# Patient Record
Sex: Male | Born: 1965 | Race: Black or African American | Hispanic: No | Marital: Married | Smoking: Never smoker
Health system: Southern US, Community
[De-identification: ages and names within clinical notes are randomized; demographics above are authoritative.]

## PROBLEM LIST (undated history)

## (undated) DIAGNOSIS — S92919A Unspecified fracture of unspecified toe(s), initial encounter for closed fracture: Secondary | ICD-10-CM

## (undated) DIAGNOSIS — N189 Chronic kidney disease, unspecified: Secondary | ICD-10-CM

## (undated) DIAGNOSIS — S62609A Fracture of unspecified phalanx of unspecified finger, initial encounter for closed fracture: Secondary | ICD-10-CM

## (undated) DIAGNOSIS — E669 Obesity, unspecified: Secondary | ICD-10-CM

## (undated) HISTORY — DX: Unspecified fracture of unspecified toe(s), initial encounter for closed fracture: S92.919A

## (undated) HISTORY — PX: OTHER SURGICAL HISTORY: SHX169

## (undated) HISTORY — DX: Fracture of unspecified phalanx of unspecified finger, initial encounter for closed fracture: S62.609A

## (undated) HISTORY — PX: TONSILLECTOMY: SUR1361

## (undated) HISTORY — DX: Chronic kidney disease, unspecified: N18.9

## (undated) HISTORY — DX: Obesity, unspecified: E66.9

---

## 2000-03-15 ENCOUNTER — Encounter: Payer: Self-pay | Admitting: Family Medicine

## 2000-03-15 ENCOUNTER — Encounter: Admission: RE | Admit: 2000-03-15 | Discharge: 2000-03-15 | Payer: Self-pay | Admitting: Family Medicine

## 2000-03-19 ENCOUNTER — Ambulatory Visit (HOSPITAL_BASED_OUTPATIENT_CLINIC_OR_DEPARTMENT_OTHER): Admission: RE | Admit: 2000-03-19 | Discharge: 2000-03-19 | Payer: Self-pay | Admitting: *Deleted

## 2008-01-22 ENCOUNTER — Emergency Department (HOSPITAL_COMMUNITY): Admission: EM | Admit: 2008-01-22 | Discharge: 2008-01-22 | Payer: Self-pay | Admitting: Emergency Medicine

## 2010-07-04 NOTE — Op Note (Signed)
[name]  Patient:    Steven Dawson, Steven Dawson                            MRN: [id number] Proc. Date: 03/19/00 Attending:  Lowell Bouton, M.D.                           Operative Report  PREOPERATIVE DIAGNOSIS:  Fracture-subluxation carpometacarpal joint, right thumb.  POSTOPERATIVE DIAGNOSIS: Fracture-subluxation carpometacarpal joint, right thumb.  PROCEDURE:  Closed reduction and percutaneous pinning of Bennett fracture, right thumb.  SURGEON:  Lowell Bouton, M.D.  ANESTHESIA:  General.  OPERATIVE FINDINGS:  The patient had a stable Bennetts fracture that was two weeks old.  The joint was partially subluxed.  DESCRIPTION OF PROCEDURE:  Under general anesthesia, the right hand was prepped and draped in the usual fashion and under x-ray control, a closed reduction was performed of the [facility] joint of the right thumb.  A 45 K-wire was placed through the first metacarpal, across the joint, into the trapezium. X-rays showed good alignment.  The pin was bent over and left protruding from the skin.  Sterile dressings were applied, followed by a thumb spica splint. The patient tolerated the procedure well and went to the recovery room awake and stable, in good condition. DD:  03/19/00 TD:  03/20/00 Job: [id number]

## 2013-05-04 ENCOUNTER — Ambulatory Visit
Admission: RE | Admit: 2013-05-04 | Discharge: 2013-05-04 | Disposition: A | Discharge status: Home / Self Care | Payer: BC Managed Care – PPO | Source: Ambulatory Visit | Attending: Family | Admitting: Family

## 2013-05-04 ENCOUNTER — Other Ambulatory Visit: Payer: Self-pay | Admitting: Family

## 2013-05-04 DIAGNOSIS — R0989 Other specified symptoms and signs involving the circulatory and respiratory systems: Secondary | ICD-10-CM

## 2013-05-04 DIAGNOSIS — R059 Cough, unspecified: Secondary | ICD-10-CM

## 2013-05-04 DIAGNOSIS — R05 Cough: Secondary | ICD-10-CM

## 2013-05-04 DIAGNOSIS — R0609 Other forms of dyspnea: Secondary | ICD-10-CM

## 2013-08-09 ENCOUNTER — Encounter: Payer: Self-pay | Admitting: Cardiology

## 2013-09-05 ENCOUNTER — Ambulatory Visit: Payer: Self-pay | Admitting: Cardiology

## 2013-10-02 ENCOUNTER — Encounter: Payer: Self-pay | Admitting: *Deleted

## 2013-10-06 ENCOUNTER — Ambulatory Visit (INDEPENDENT_AMBULATORY_CARE_PROVIDER_SITE_OTHER): Payer: BC Managed Care – PPO | Admitting: Cardiology

## 2013-10-06 ENCOUNTER — Encounter: Payer: Self-pay | Admitting: Cardiology

## 2013-10-06 DIAGNOSIS — I444 Left anterior fascicular block: Secondary | ICD-10-CM | POA: Insufficient documentation

## 2013-10-06 DIAGNOSIS — I4460 Unspecified fascicular block: Secondary | ICD-10-CM

## 2013-10-06 NOTE — Progress Notes (Signed)
      [address] [place], [place]  [address] Phone: [phone] Fax:  [phone]  Date:  10/06/2013   ID:  Steven Dawson Steven Dawson, DOB 1965/08/11, MRN [id number]  PCP:  No primary provider on file.   History of Present Illness: Steven Dawson is a 48 y.o. male with previous evaluation of chest discomfort.  His EKG demonstrated a sinus rhythm rate 67, left anterior fascicular block, nonspecific T-wave changes with subtle T-wave inversion in V3, flattening in the lateral precordial leads. His LDL cholesterol was 148, HDL 49. TSH was normal. He's also had some issues with fecal incontinence as well as morbid obesity.   About once a year he will have discomfort when breathing that comes on all of a sudden with pain in his upper neck, back of throat. When it happens he pauses and waits, takes a few deep breaths, duration for about 30 seconds and then it goes away. Never exertional. He has noted wheezing at times when he exerts himself. A stress test was performed on 03/07/12 where he exercised for 6 minutes and 27 seconds with nonspecific ST changes, slight wheezing as well as one out of 10 discomfort. His blood pressure was elevated at 140/102 at rest and 208/90 at stress.  No recent chest pain.  Trying new diet.     Wt Readings from Last 3 Encounters:  10/06/13 349 lb 6.4 oz (158.487 kg)     Past Medical History  Diagnosis Date  . Chronic kidney disease   . Broken fingers   . Broken toe   . Obesity     Past Surgical History  Procedure Laterality Date  . Tonsillectomy    . Pin in thumb      No current outpatient prescriptions on file.   No current facility-administered medications for this visit.    Allergies:   No Known Allergies  Social History:  The patient  reports that he has never smoked. He does not have any smokeless tobacco history on file. He reports that he drinks about 1.2 ounces of alcohol per week.   Family History  Problem Relation Age of Onset  .  Cancer Father   . Ulcers Brother     internal bleeding    ROS:  Please see the history of present illness.   No chest pain, no syncope, no bleeding, no orthopnea.   PHYSICAL EXAM: VS:  BP 145/91  Pulse 66  Ht 6\' 5"  (1.956 Steven)  Wt 349 lb 6.4 oz (158.487 kg)  BMI 41.42 kg/m2 Well nourished, well developed, in no acute distress HEENT: normal, [place]/AT, EOMI Neck: no JVD, normal carotid upstroke, no bruit Cardiac:  normal S1, S2; RRR; no murmur Lungs:  clear to auscultation bilaterally, no wheezing, rhonchi or rales Abd: soft, nontender, no hepatomegaly, no bruitsobese Ext: no edema, 2+ distal pulses Skin: warm and dry GU: deferred Neuro: no focal abnormalities noted, AAO x 3  EKG:  10/06/13-sinus rhythm, left anterior fascicular block, nonspecific ST-T wave changes    ASSESSMENT AND PLAN:  1. Morbid obesity-continue to work on weight loss. He is trying a new diet. Both he and his wife are using. He has lost approximately 13 pounds. No active chest pain. Overall doing well. If symptoms return or become more worrisome, he may return in followup. 2. 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 3. PRN follow up.   Signed, Steven SchultzMark Lyndsee Casa, MD [facility]  10/06/2013 4:16 PM

## 2013-10-06 NOTE — Patient Instructions (Signed)
The current medical regimen is effective;  continue present plan and medications.  Follow up as needed 

## 2015-01-23 ENCOUNTER — Other Ambulatory Visit: Payer: Self-pay | Admitting: Family Medicine

## 2015-01-23 ENCOUNTER — Ambulatory Visit
Admission: RE | Admit: 2015-01-23 | Discharge: 2015-01-23 | Disposition: A | Discharge status: Home / Self Care | Payer: Self-pay | Source: Ambulatory Visit | Attending: Family Medicine | Admitting: Family Medicine

## 2015-01-23 DIAGNOSIS — R059 Cough, unspecified: Secondary | ICD-10-CM

## 2015-01-23 DIAGNOSIS — R05 Cough: Secondary | ICD-10-CM

## 2015-02-05 ENCOUNTER — Other Ambulatory Visit: Payer: Self-pay | Admitting: Family

## 2015-02-05 ENCOUNTER — Ambulatory Visit
Admission: RE | Admit: 2015-02-05 | Discharge: 2015-02-05 | Disposition: A | Discharge status: Home / Self Care | Payer: No Typology Code available for payment source | Source: Ambulatory Visit | Attending: Family | Admitting: Family

## 2015-02-05 DIAGNOSIS — R059 Cough, unspecified: Secondary | ICD-10-CM

## 2015-02-05 DIAGNOSIS — R05 Cough: Secondary | ICD-10-CM

## 2015-02-05 DIAGNOSIS — Z8701 Personal history of pneumonia (recurrent): Secondary | ICD-10-CM

## 2015-02-22 ENCOUNTER — Other Ambulatory Visit: Payer: Self-pay | Admitting: Family

## 2015-02-22 ENCOUNTER — Ambulatory Visit
Admission: RE | Admit: 2015-02-22 | Discharge: 2015-02-22 | Disposition: A | Discharge status: Home / Self Care | Payer: BLUE CROSS/BLUE SHIELD | Source: Ambulatory Visit | Attending: Family | Admitting: Family

## 2015-02-22 DIAGNOSIS — Z8701 Personal history of pneumonia (recurrent): Secondary | ICD-10-CM

## 2015-03-22 ENCOUNTER — Encounter: Payer: Self-pay | Admitting: Internal Medicine

## 2015-03-22 ENCOUNTER — Other Ambulatory Visit: Payer: Self-pay | Admitting: Internal Medicine

## 2015-03-22 ENCOUNTER — Telehealth: Payer: Self-pay | Admitting: Internal Medicine

## 2015-03-22 ENCOUNTER — Ambulatory Visit (INDEPENDENT_AMBULATORY_CARE_PROVIDER_SITE_OTHER): Payer: BLUE CROSS/BLUE SHIELD | Admitting: Internal Medicine

## 2015-03-22 ENCOUNTER — Other Ambulatory Visit (INDEPENDENT_AMBULATORY_CARE_PROVIDER_SITE_OTHER): Payer: BLUE CROSS/BLUE SHIELD

## 2015-03-22 ENCOUNTER — Encounter (INDEPENDENT_AMBULATORY_CARE_PROVIDER_SITE_OTHER): Payer: Self-pay

## 2015-03-22 ENCOUNTER — Ambulatory Visit (INDEPENDENT_AMBULATORY_CARE_PROVIDER_SITE_OTHER)
Admission: RE | Admit: 2015-03-22 | Discharge: 2015-03-22 | Disposition: A | Discharge status: Home / Self Care | Payer: BLUE CROSS/BLUE SHIELD | Source: Ambulatory Visit | Attending: Internal Medicine | Admitting: Internal Medicine

## 2015-03-22 VITALS — BP 138/80 | HR 104 | Ht 77.0 in | Wt 367.0 lb

## 2015-03-22 DIAGNOSIS — R058 Other specified cough: Secondary | ICD-10-CM

## 2015-03-22 DIAGNOSIS — R05 Cough: Secondary | ICD-10-CM | POA: Diagnosis not present

## 2015-03-22 DIAGNOSIS — R918 Other nonspecific abnormal finding of lung field: Secondary | ICD-10-CM

## 2015-03-22 LAB — SEDIMENTATION RATE: Sed Rate: 7 mm/hr (ref 0–22)

## 2015-03-22 LAB — CBC WITH DIFFERENTIAL/PLATELET
BASOS ABS: 0 10*3/uL (ref 0.0–0.1)
Basophils Relative: 0.4 % (ref 0.0–3.0)
EOS ABS: 0.2 10*3/uL (ref 0.0–0.7)
Eosinophils Relative: 2.3 % (ref 0.0–5.0)
HCT: 45.2 % (ref 39.0–52.0)
Hemoglobin: 15.2 g/dL (ref 13.0–17.0)
LYMPHS ABS: 2.2 10*3/uL (ref 0.7–4.0)
Lymphocytes Relative: 29 % (ref 12.0–46.0)
MCHC: 33.7 g/dL (ref 30.0–36.0)
MCV: 91.4 fl (ref 78.0–100.0)
Monocytes Absolute: 0.4 10*3/uL (ref 0.1–1.0)
Monocytes Relative: 5.3 % (ref 3.0–12.0)
NEUTROS ABS: 4.8 10*3/uL (ref 1.4–7.7)
NEUTROS PCT: 63 % (ref 43.0–77.0)
PLATELETS: 278 10*3/uL (ref 150.0–400.0)
RBC: 4.95 Mil/uL (ref 4.22–5.81)
RDW: 14 % (ref 11.5–15.5)
WBC: 7.6 10*3/uL (ref 4.0–10.5)

## 2015-03-22 NOTE — Telephone Encounter (Signed)
Notes Recorded by Nyoka Cowden, MD on 03/22/2015 at 11:40 AM Call pt: Reviewed cxr and no acute change so no change in recommendations made at ov --  I spoke with patient about results and he verbalized understanding and had no questions.

## 2015-03-22 NOTE — Progress Notes (Signed)
Subjective:    Patient ID: Steven Dawson, male    DOB: 1966-02-08,     MRN: [id number]  HPI  57  yobm never smoker acutely ill late October 2016 with severe cough and pleuritic pain R lower ant chest > yellow mucus > rx 2 different abx/ pred/ inhaler >  referred to pulmonary clinic 03/22/2015 by   Boneta Lucks  PA for ? Persistent infiltrates p approp rx for CAP   03/22/2015 1st [facility] office visit/ Steven Dawson   Chief Complaint  Patient presents with  . Pulmonary Consult    Referred by Boneta Lucks, PA. Pt states was dxed with PNA in Nov 2016. He has occ non prod cough and feels the need to clear his throat often.   R ant cp gone,  Rare need to clear throat but freq sensation of throat tickle day >>noct x years that is worse since onset of acute symptoms in Oct 2016 but no noct symptoms, wheeze, or sob  No obvious day to day or daytime variabilty or assoc  cp or chest tightness, subjective wheeze or overt hb symptoms. No unusual exp hx or h/o childhood pna/ asthma or knowledge of premature birth.  Sleeping ok without nocturnal  or early am exacerbation  of respiratory  c/o's or need for noct saba. Also denies any obvious fluctuation of symptoms with weather or environmental changes or other aggravating or alleviating factors except as outlined above   Current Medications, Allergies, Complete Past Medical History, Past Surgical History, Family History, and Social History were reviewed in [employer] record.             Review of Systems  Constitutional: Negative for fever, chills, activity change, appetite change and unexpected weight change.  HENT: Negative for congestion, dental problem, postnasal drip, rhinorrhea, sneezing, sore throat, trouble swallowing and voice change.   Eyes: Negative for visual disturbance.  Respiratory: Negative for cough, choking and shortness of breath.   Cardiovascular: Negative for chest pain and leg swelling.    Gastrointestinal: Negative for nausea, vomiting and abdominal pain.  Genitourinary: Negative for difficulty urinating.  Musculoskeletal: Negative for arthralgias.  Skin: Negative for rash.  Psychiatric/Behavioral: Negative for behavioral problems and confusion.       Objective:   Physical Exam  amb bm with nasal tone to voice  Wt Readings from Last 3 Encounters:  03/22/15 367 lb (166.47 kg)  10/06/13 349 lb 6.4 oz (158.487 kg)    Vital signs reviewed  HEENT: nl dentition, turbinates, and oropharynx. Nl external ear canals without cough reflex   NECK :  without JVD/Nodes/TM/ nl carotid upstrokes bilaterally   LUNGS: no acc muscle use,  Nl contour chest which is clear to A and P bilaterally without cough on insp or exp maneuvers   CV:  RRR  no s3 or murmur or increase in P2, no edema   ABD:  soft and nontender with nl inspiratory excursion in the supine position. No bruits or organomegaly, bowel sounds nl  MS:  Nl gait/ ext warm without deformities, calf tenderness, cyanosis or clubbing No obvious joint restrictions   SKIN: warm and dry without lesions    NEURO:  alert, approp, nl sensorium with  no motor deficits     CXR PA and Lateral:   03/22/2015 :    I personally reviewed images and agree with radiology impression as follows:   There is no evidence of residual pneumonia nor evidence of other active cardiopulmonary disease.  Labs 03/22/2015  Lab Results  Component Value Date   WBC 7.6 03/22/2015   HGB 15.2 03/22/2015   HCT 45.2 03/22/2015   MCV 91.4 03/22/2015   PLT 278.0 03/22/2015       EOS                      0.2    Lab Results  Component Value Date   ESRSEDRATE 7 03/22/2015       Assessment & Plan:

## 2015-03-22 NOTE — Patient Instructions (Addendum)
Please see patient coordinator before you leave today  to schedule sinus CT  GERD (REFLUX)  is an extremely common cause of respiratory symptoms just like yours , many times with no obvious heartburn at all.    It can be treated with medication, but also with lifestyle changes including elevation of the head of your bed (ideally with 6 inch  bed blocks),  Smoking cessation, avoidance of late meals, excessive alcohol, and avoid fatty foods, chocolate, peppermint, colas, red wine, and acidic juices such as orange juice.  NO MINT OR MENTHOL PRODUCTS SO NO COUGH DROPS  USE SUGARLESS CANDY INSTEAD (Jolley ranchers or Stover's or Life Savers) or even ice chips will also do - the key is to swallow to prevent all throat clearing. NO OIL BASED VITAMINS - use powdered substitutes.   Please remember to go to the lab and x-ray department downstairs for your tests - we will call you with the results when they are available.  If can't stop clearing your throat on the above plan I would next add prilosec 20 mg Take 30-60 min before first meal of the day and return here after 2 weeks if not improving

## 2015-03-22 NOTE — Telephone Encounter (Signed)
[phone] calling back

## 2015-03-22 NOTE — Progress Notes (Signed)
Quick Note:  LMTCB ______ 

## 2015-03-23 ENCOUNTER — Encounter: Payer: Self-pay | Admitting: Internal Medicine

## 2015-03-23 DIAGNOSIS — R918 Other nonspecific abnormal finding of lung field: Secondary | ICD-10-CM | POA: Insufficient documentation

## 2015-03-23 LAB — SICKLE CELL SCREEN: SICKLE CELL SCREEN: NEGATIVE

## 2015-03-23 NOTE — Assessment & Plan Note (Signed)
Body mass index is 43.51 kg/(m^2).  No results found for: TSH   Contributing to gerd tendency/ doe/reviewed the need and the process to achieve and maintain neg calorie balance > defer f/u primary care including intermittently monitoring thyroid status

## 2015-03-23 NOTE — Assessment & Plan Note (Addendum)
Classic Upper airway cough syndrome, so named because it's frequently impossible to sort out how much is  CR/sinusitis with freq throat clearing (which can be related to primary GERD)   vs  causing  secondary (" extra esophageal")  GERD from wide swings in gastric pressure that occur with throat clearing, often  promoting self use of mint and menthol lozenges that reduce the lower esophageal sphincter tone and exacerbate the problem further in a cyclical fashion.   These are the same pts (now being labeled as having "irritable larynx syndrome" by some cough centers) who not infrequently have a history of having failed to tolerate ace inhibitors,  dry powder inhalers or biphosphonates or report having atypical reflux symptoms that don't respond to standard doses of PPI , and are easily confused as having aecopd or asthma flares by even experienced allergists/ pulmonologists.   rec first treat with diet/ instructions not to clear throat and use non-mint/menthol hard rock candies for this and if not better add otc ACID suppression next along with sinus CT/allergy profile  now to complete the w/u

## 2015-03-23 NOTE — Assessment & Plan Note (Addendum)
Assoc with clinical picture of pna, though time course is a bit odd suggesting he may have an area of bronchiectasis or "organizing pna" ( ruled out now with nl esr)  and if has recurrent pna in same area need a high res CT to define the problem in more detail  - as his plain cxr and exam and hx are unimpressive and he's never smoked would not pursue further studies at this point.  Pulmonary f/u can be prn  Total time devoted to counseling  = 35/67mreview case with pt/ discussion of options/alternatives/ personally creating in presence of pt  then going over specific  Instructions directly with the pt including how to use all of the meds but in particular covering each new medication in detail (see avs)

## 2015-03-25 ENCOUNTER — Inpatient Hospital Stay: Admission: RE | Admit: 2015-03-25 | Payer: BLUE CROSS/BLUE SHIELD | Source: Ambulatory Visit

## 2015-03-26 LAB — RESPIRATORY ALLERGY PROFILE REGION II ~~LOC~~
Allergen, Cedar tree, t12: <0.1 kU/L
Allergen, Mouse Urine Protein, e78: <0.1 kU/L
Alternaria Alternata: <0.1 kU/L
Aspergillus fumigatus, m3: <0.1 kU/L
Box Elder IgE: <0.1 kU/L
Cat Dander: <0.1 kU/L
Common Ragweed: <0.1 kU/L
IGE (IMMUNOGLOBULIN E), SERUM: 25 kU/L (ref <115)
Johnson Grass: <0.1 kU/L
Pecan/Hickory Tree IgE: <0.1 kU/L
Penicillium Notatum: <0.1 kU/L
Rough Pigweed  IgE: <0.1 kU/L
Sheep Sorrel IgE: <0.1 kU/L
Timothy Grass: <0.1 kU/L

## 2015-04-01 ENCOUNTER — Ambulatory Visit (INDEPENDENT_AMBULATORY_CARE_PROVIDER_SITE_OTHER)
Admission: RE | Admit: 2015-04-01 | Discharge: 2015-04-01 | Disposition: A | Discharge status: Home / Self Care | Payer: BLUE CROSS/BLUE SHIELD | Source: Ambulatory Visit | Attending: Internal Medicine | Admitting: Internal Medicine

## 2015-04-01 DIAGNOSIS — R05 Cough: Secondary | ICD-10-CM

## 2015-04-02 ENCOUNTER — Telehealth: Payer: Self-pay | Admitting: Internal Medicine

## 2015-04-02 NOTE — Telephone Encounter (Signed)
Patient returned call, CB [phone]. °

## 2015-04-02 NOTE — Telephone Encounter (Signed)
Result Notes     Notes Recorded by Lorel Monaco, CMA on 04/02/2015 at 1:24 PM lmtcb x1 for pt. ------  Notes Recorded by Nyoka Cowden, MD on 04/01/2015 at 4:16 PM Call patient : Study is unremarkable, no change in recs   lmtcb x2 for pt.

## 2015-04-02 NOTE — Telephone Encounter (Signed)
Patient returned call, CB [phone].

## 2015-04-02 NOTE — Telephone Encounter (Signed)
LMOMTCB x 1 

## 2015-04-03 ENCOUNTER — Ambulatory Visit (INDEPENDENT_AMBULATORY_CARE_PROVIDER_SITE_OTHER): Payer: BLUE CROSS/BLUE SHIELD

## 2015-04-03 DIAGNOSIS — Z23 Encounter for immunization: Secondary | ICD-10-CM | POA: Diagnosis not present

## 2015-04-03 NOTE — Telephone Encounter (Signed)
It is all negative. I apologized for delay but it trickled down one piece of the time. He does not have any evidence of sickle cell disease or an allergy of any type. Please let me know if he is not doing better on the regimen I recommended that he should return with all medications in hand to regroup.Marland Kitchen

## 2015-04-03 NOTE — Telephone Encounter (Signed)
Spoke with pt. He is aware of his CT results. Would like his results from his blood work done on 03/22/15.  MW - please advise. Thanks.  Patient Instructions     Please see patient coordinator before you leave today to schedule sinus CT  GERD (REFLUX) is an extremely common cause of respiratory symptoms just like yours , many times with no obvious heartburn at all.   It can be treated with medication, but also with lifestyle changes including elevation of the head of your bed (ideally with 6 inch bed blocks), Smoking cessation, avoidance of late meals, excessive alcohol, and avoid fatty foods, chocolate, peppermint, colas, red wine, and acidic juices such as orange juice.  NO MINT OR MENTHOL PRODUCTS SO NO COUGH DROPS  USE SUGARLESS CANDY INSTEAD (Jolley ranchers or Stover's or Life Savers) or even ice chips will also do - the key is to swallow to prevent all throat clearing. NO OIL BASED VITAMINS - use powdered substitutes.   Please remember to go to the lab and x-ray department downstairs for your tests - we will call you with the results when they are available.  If can't stop clearing your throat on the above plan I would next add prilosec 20 mg Take 30-60 min before first meal of the day and return here after 2 weeks if not improving

## 2015-04-03 NOTE — Telephone Encounter (Signed)
I spoke with patient about results and he verbalized understanding and had no questions. He wanted to come in for flu vaccine today. Placed on injection schedule. Nothing further needed

## 2015-04-09 ENCOUNTER — Ambulatory Visit: Payer: BLUE CROSS/BLUE SHIELD | Admitting: Internal Medicine

## 2015-05-23 DIAGNOSIS — J019 Acute sinusitis, unspecified: Secondary | ICD-10-CM | POA: Diagnosis not present

## 2015-05-23 DIAGNOSIS — M25522 Pain in left elbow: Secondary | ICD-10-CM | POA: Diagnosis not present

## 2015-05-23 DIAGNOSIS — B9689 Other specified bacterial agents as the cause of diseases classified elsewhere: Secondary | ICD-10-CM | POA: Diagnosis not present

## 2015-05-31 DIAGNOSIS — R05 Cough: Secondary | ICD-10-CM | POA: Diagnosis not present

## 2015-06-18 ENCOUNTER — Ambulatory Visit
Admission: RE | Admit: 2015-06-18 | Discharge: 2015-06-18 | Disposition: A | Discharge status: Home / Self Care | Payer: BLUE CROSS/BLUE SHIELD | Source: Ambulatory Visit | Attending: Family Medicine | Admitting: Family Medicine

## 2015-06-18 ENCOUNTER — Other Ambulatory Visit: Payer: Self-pay | Admitting: Family Medicine

## 2015-06-18 DIAGNOSIS — R05 Cough: Secondary | ICD-10-CM

## 2015-06-18 DIAGNOSIS — R059 Cough, unspecified: Secondary | ICD-10-CM

## 2015-06-18 DIAGNOSIS — R0989 Other specified symptoms and signs involving the circulatory and respiratory systems: Secondary | ICD-10-CM | POA: Diagnosis not present

## 2015-11-19 DIAGNOSIS — N182 Chronic kidney disease, stage 2 (mild): Secondary | ICD-10-CM | POA: Diagnosis not present

## 2015-11-20 DIAGNOSIS — Z6841 Body Mass Index (BMI) 40.0 and over, adult: Secondary | ICD-10-CM | POA: Diagnosis not present

## 2015-11-20 DIAGNOSIS — N281 Cyst of kidney, acquired: Secondary | ICD-10-CM | POA: Diagnosis not present

## 2015-11-20 DIAGNOSIS — N182 Chronic kidney disease, stage 2 (mild): Secondary | ICD-10-CM | POA: Diagnosis not present

## 2016-05-01 DIAGNOSIS — Z0000 Encounter for general adult medical examination without abnormal findings: Secondary | ICD-10-CM | POA: Diagnosis not present

## 2016-05-07 DIAGNOSIS — Z0000 Encounter for general adult medical examination without abnormal findings: Secondary | ICD-10-CM | POA: Diagnosis not present

## 2016-11-13 DIAGNOSIS — N182 Chronic kidney disease, stage 2 (mild): Secondary | ICD-10-CM | POA: Diagnosis not present

## 2016-11-19 DIAGNOSIS — N281 Cyst of kidney, acquired: Secondary | ICD-10-CM | POA: Diagnosis not present

## 2016-11-19 DIAGNOSIS — N182 Chronic kidney disease, stage 2 (mild): Secondary | ICD-10-CM | POA: Diagnosis not present

## 2016-11-19 DIAGNOSIS — Z6841 Body Mass Index (BMI) 40.0 and over, adult: Secondary | ICD-10-CM | POA: Diagnosis not present

## 2016-11-23 DIAGNOSIS — N281 Cyst of kidney, acquired: Secondary | ICD-10-CM | POA: Diagnosis not present

## 2017-03-24 DIAGNOSIS — M2142 Flat foot [pes planus] (acquired), left foot: Secondary | ICD-10-CM | POA: Diagnosis not present

## 2017-03-24 DIAGNOSIS — M2141 Flat foot [pes planus] (acquired), right foot: Secondary | ICD-10-CM | POA: Diagnosis not present

## 2017-04-17 DIAGNOSIS — R05 Cough: Secondary | ICD-10-CM | POA: Diagnosis not present

## 2017-04-17 DIAGNOSIS — J101 Influenza due to other identified influenza virus with other respiratory manifestations: Secondary | ICD-10-CM | POA: Diagnosis not present

## 2017-04-23 DIAGNOSIS — M79671 Pain in right foot: Secondary | ICD-10-CM | POA: Diagnosis not present

## 2017-04-23 DIAGNOSIS — R030 Elevated blood-pressure reading, without diagnosis of hypertension: Secondary | ICD-10-CM | POA: Diagnosis not present

## 2017-04-23 DIAGNOSIS — J101 Influenza due to other identified influenza virus with other respiratory manifestations: Secondary | ICD-10-CM | POA: Diagnosis not present

## 2017-05-03 ENCOUNTER — Other Ambulatory Visit: Payer: Self-pay | Admitting: Family Medicine

## 2017-05-03 DIAGNOSIS — M79671 Pain in right foot: Secondary | ICD-10-CM

## 2017-05-04 DIAGNOSIS — Z23 Encounter for immunization: Secondary | ICD-10-CM | POA: Diagnosis not present

## 2017-05-04 DIAGNOSIS — Z0000 Encounter for general adult medical examination without abnormal findings: Secondary | ICD-10-CM | POA: Diagnosis not present

## 2017-05-07 DIAGNOSIS — R7309 Other abnormal glucose: Secondary | ICD-10-CM | POA: Diagnosis not present

## 2017-05-09 ENCOUNTER — Inpatient Hospital Stay
Admission: RE | Admit: 2017-05-09 | Discharge: 2017-05-09 | Disposition: A | Discharge status: Home / Self Care | Payer: BLUE CROSS/BLUE SHIELD | Source: Ambulatory Visit | Attending: Family Medicine | Admitting: Family Medicine

## 2017-05-10 ENCOUNTER — Ambulatory Visit
Admission: RE | Admit: 2017-05-10 | Discharge: 2017-05-10 | Disposition: A | Discharge status: Home / Self Care | Payer: BLUE CROSS/BLUE SHIELD | Source: Ambulatory Visit | Attending: Family Medicine | Admitting: Family Medicine

## 2017-05-10 DIAGNOSIS — M79671 Pain in right foot: Secondary | ICD-10-CM

## 2017-05-10 DIAGNOSIS — M19071 Primary osteoarthritis, right ankle and foot: Secondary | ICD-10-CM | POA: Diagnosis not present

## 2017-05-12 DIAGNOSIS — N182 Chronic kidney disease, stage 2 (mild): Secondary | ICD-10-CM | POA: Diagnosis not present

## 2017-05-13 ENCOUNTER — Encounter: Payer: Self-pay | Admitting: Dietician

## 2017-05-13 ENCOUNTER — Encounter: Payer: BLUE CROSS/BLUE SHIELD | Attending: Family Medicine | Admitting: Dietician

## 2017-05-13 DIAGNOSIS — R7303 Prediabetes: Secondary | ICD-10-CM | POA: Insufficient documentation

## 2017-05-13 DIAGNOSIS — Z713 Dietary counseling and surveillance: Secondary | ICD-10-CM | POA: Diagnosis not present

## 2017-05-13 NOTE — Progress Notes (Signed)
  Medical Nutrition Therapy:  Appt start time: 1415 end time:  1515.   Assessment:  Primary concerns today: Obesity and prediabetes.  Most recent A1c was 5.9%.  Patient started an exercise routine 1 year ago and is now working out 4-5 days per week for 1 hour at the gym, mostly with a Systems analystpersonal trainer.  He has lost approximately 35 lbs over the last year.  He states that he is motivated to manage his blood sugar levels and focus on creating some healthy food habits this next year.  He does occasional fasting throughout the year, normally twice per year for 2-3 days, but occasionally longer.  He has tried intermittent fasting but has noticed a challenge with managing portions and cravings later in the day when he skips breakfast for the fasting schedule.  He does his exercise in the morning so this is also a problem as far as refueling after his workouts.  He is open to recommendations for dietary strategies to improve his eating habits, lower his A1c and help him continue to lose weight.    Preferred Learning Style:  No preference indicated   Learning Readiness:   Ready  MEDICATIONS: none   DIETARY INTAKE:  Usual eating pattern includes 2-3 meals and 2-3 snacks per day.  24-hr recall:  B ( AM): eggs, croissant, guacamole, and salsa with coffee from [employer] or skips and just has coffee Snk ( AM): none L ( PM): grilled salmon with broccolli and small serving of rice Snk ( PM): celery with guacamole or hard boiled eggs D ( PM): celery, cashews, 1 lbsp peanut butter OR out to eat with his sons when traveling for sports Snk ( PM): veggies and dip or sweets  Usual physical activity: 4-5 days per week 1 hour in the gym doing light cardio, heavy weight training  Progress Towards Goal(s):  In progress.   Nutritional Diagnosis:  NB-1.1 Food and nutrition-related knowledge deficit As related to no prior formal nutrition education.  As evidenced by patient request for dietary advice to maintain  proper blood sugar levels and weight loss.    Intervention:  Nutrition education and counseling.  Discussed his nutrition goals and habit changes over the past year.  Discussed balance and moderation utilizing the portion plate as a guide for portion control.  Discussed importance of meal timing and appetite management.  Discussed aspects of mindfulness around eating and cravings.  Patient states he has a big sweet tooth.  We discussed role of deprivation and restriction in leading to food urges/cravings as well as the emotional eating aspect of choosing food for excitement, pleasure, comfort, or distraction.  Reviewed a moderate carbohydrate plan for him, providing sample meals and snack ideas.  Assessed his readiness to change and motivation level.  Answered his nutrition questions.  Teaching Method Utilized: Visual Auditory Hands on  Handouts given during visit include:  Low Carbohydrate Snack Suggestions  Sample Meal Plans for Men (45 grams of Carbohydrate)  Portion Plate  Weekly Meal Plan Template  Barriers to learning/adherence to lifestyle change: none  Demonstrated degree of understanding via:  Teach Back   Monitoring/Evaluation:  Dietary intake, exercise, progress towards goals, and body weight in 3 month(s).

## 2017-05-20 DIAGNOSIS — N281 Cyst of kidney, acquired: Secondary | ICD-10-CM | POA: Diagnosis not present

## 2017-05-20 DIAGNOSIS — N182 Chronic kidney disease, stage 2 (mild): Secondary | ICD-10-CM | POA: Diagnosis not present

## 2017-05-20 DIAGNOSIS — R7303 Prediabetes: Secondary | ICD-10-CM | POA: Diagnosis not present

## 2017-05-28 DIAGNOSIS — M7750 Other enthesopathy of unspecified foot: Secondary | ICD-10-CM | POA: Diagnosis not present

## 2017-06-01 DIAGNOSIS — M7750 Other enthesopathy of unspecified foot: Secondary | ICD-10-CM | POA: Diagnosis not present

## 2017-07-08 DIAGNOSIS — M7750 Other enthesopathy of unspecified foot: Secondary | ICD-10-CM | POA: Diagnosis not present

## 2017-07-14 DIAGNOSIS — M7750 Other enthesopathy of unspecified foot: Secondary | ICD-10-CM | POA: Diagnosis not present

## 2017-07-21 DIAGNOSIS — M7750 Other enthesopathy of unspecified foot: Secondary | ICD-10-CM | POA: Diagnosis not present

## 2017-07-26 DIAGNOSIS — M7750 Other enthesopathy of unspecified foot: Secondary | ICD-10-CM | POA: Diagnosis not present

## 2017-07-29 ENCOUNTER — Encounter: Payer: BLUE CROSS/BLUE SHIELD | Attending: Family Medicine | Admitting: Registered"

## 2017-07-29 ENCOUNTER — Encounter: Payer: Self-pay | Admitting: Registered"

## 2017-07-29 DIAGNOSIS — R7303 Prediabetes: Secondary | ICD-10-CM

## 2017-07-29 DIAGNOSIS — Z713 Dietary counseling and surveillance: Secondary | ICD-10-CM | POA: Insufficient documentation

## 2017-07-29 NOTE — Progress Notes (Signed)
Medical Nutrition Therapy:  Appt start time: 1505 end time:  1550.   Assessment:  Primary concerns today: Obesity and prediabetes.  Most recent A1c was 5.9%.  Nutrition Follow-Up: Pt present for appointment. Pt reports that he has been reducing the amount of sweeteners and creamers that he has in his beverages. He reports that now he does not have to have foods as sweet as before. He reports that he has been trying to follow meal pattern discussed with dietitian at last visit. He reports that he has still not been having breakfast each day-sometimes will drink a protein shake for breakfast or may have fruit and nuts. Pt reports that sometimes he does intermittent fasting from 8 PM to 8 AM and will work out during his fasting period. Pt reports he goes to PT for foot, elbow, wrist, shoulder and back pain. He reports that he typically gets around 5-6 hours of sleep per night during the week; often  more on the weekends.   Preferred Learning Style:  No preference indicated   Learning Readiness:   Ready  MEDICATIONS: none   DIETARY INTAKE:  Usual eating pattern includes 2 meals and 2 snacks per day. Usually drinks a protein shake for breakfast such as Core, Evolve, or Isopure or may have some fruit and nuts. Typical snack foods include-almonds, [place] jerky.   24-hr recall:  (430 AM): water before gym  B (630-7 AM) Evolve protein shake, bottle of water Snk (AM) Almonds  L ( PM): Core protein shake, apple (typical lunch would be tuna fish, crackers, apple) Snk ( PM): None reported.  D ( PM): salmon, broccoli, rice, water OR diet drink Snk ( PM): water, coffee or tea usually unsweetened may have 1 creamer, or diet drink  Usual physical activity: 4 days per week goes to gym for 1 hour. Other activities include playing golf, coaching basketball   Progress Towards Goal(s):  In progress.   Nutritional Diagnosis:  NB-1.1 Food and nutrition-related knowledge deficit As related to no prior  formal nutrition education.  As evidenced by patient request for dietary advice to maintain proper blood sugar levels and weight loss.    Intervention:  Nutrition education and counseling. Praised pt's progress with reducing amount of creamer and sweetener in beverages. Discussed including adequate amount of carbohydrates in meals-specifically breakfast and how having a snack before going to the gym can provide more fuel for workout. Discussed having foods prepped ahead of time for breakfast and easy go to breakfast foods (Greek yogurt and fruit,nuts OR boiled eggs with whole wheat toast, fruit, etc). Discussed what a balanced plate looks like using the portion plate and provided education on which foods provide carbohydrates. Provided education on mindful eating. Discussed how having regular, consistent meals can help [place] be more mindful at our mealtimes. Pt appeared agreeable to information/goals discussed.   Instructions/Goals:  Make sure to get in three meals per day. Try to have balanced meals like the My Plate example (see handout). Try to include more vegetables, fruits, and whole grains at meals.   Continue working to have balanced meals. Recommend including around 3-4 carbohydrate choices (45-60 g carbohydrates) per meal (see balanced plate)   Recommend having easy go to foods ready to have for breakfast in the morning. Include protein and carbohydrates-balanced breakfast-ideas: Plain [place] yogurt with your own fruit and nuts or seeds; boiled eggs, whole wheat toast, fruit, etc.  Continue including regular physical activity.   Teaching Method Utilized: Visual Auditory Hands on  Handouts given during visit include:  Balanced Plate and food list   Meal Card  Barriers to learning/adherence to lifestyle change: none  Demonstrated degree of understanding via:  Teach Back   Monitoring/Evaluation:  Dietary intake, exercise, progress towards goals, and body weight prn.

## 2017-07-29 NOTE — Patient Instructions (Addendum)
Instructions/Goals:  Make sure to get in three meals per day. Try to have balanced meals like the My Plate example (see handout). Try to include more vegetables, fruits, and whole grains at meals.   Continue working to have balanced meals. Recommend including around 3-4 carbohydrate choices (45-60 g carbohydrates) per meal (see balanced plate)   Recommend having easy go to foods ready to have for breakfast in the morning. Include protein and carbohydrates-balanced breakfast-ideas: Plain [place] yogurt with your own fruit and nuts or seeds; boiled eggs, whole wheat toast, fruit, etc.  Continue including regular physical activity.

## 2017-08-11 DIAGNOSIS — M7750 Other enthesopathy of unspecified foot: Secondary | ICD-10-CM | POA: Diagnosis not present

## 2017-08-11 DIAGNOSIS — M545 Low back pain: Secondary | ICD-10-CM | POA: Diagnosis not present

## 2017-08-13 DIAGNOSIS — M25511 Pain in right shoulder: Secondary | ICD-10-CM | POA: Diagnosis not present

## 2017-08-13 DIAGNOSIS — M7671 Peroneal tendinitis, right leg: Secondary | ICD-10-CM | POA: Diagnosis not present

## 2017-08-13 DIAGNOSIS — M7702 Medial epicondylitis, left elbow: Secondary | ICD-10-CM | POA: Diagnosis not present

## 2017-08-13 DIAGNOSIS — M545 Low back pain: Secondary | ICD-10-CM | POA: Diagnosis not present

## 2017-08-23 DIAGNOSIS — M545 Low back pain: Secondary | ICD-10-CM | POA: Diagnosis not present

## 2017-08-23 DIAGNOSIS — M7750 Other enthesopathy of unspecified foot: Secondary | ICD-10-CM | POA: Diagnosis not present

## 2017-08-24 DIAGNOSIS — M7750 Other enthesopathy of unspecified foot: Secondary | ICD-10-CM | POA: Diagnosis not present

## 2017-08-24 DIAGNOSIS — M545 Low back pain: Secondary | ICD-10-CM | POA: Diagnosis not present

## 2017-09-06 DIAGNOSIS — M7702 Medial epicondylitis, left elbow: Secondary | ICD-10-CM | POA: Diagnosis not present

## 2017-09-06 DIAGNOSIS — M545 Low back pain: Secondary | ICD-10-CM | POA: Diagnosis not present

## 2017-09-13 DIAGNOSIS — M7702 Medial epicondylitis, left elbow: Secondary | ICD-10-CM | POA: Diagnosis not present

## 2017-09-13 DIAGNOSIS — M545 Low back pain: Secondary | ICD-10-CM | POA: Diagnosis not present

## 2017-09-24 DIAGNOSIS — M7702 Medial epicondylitis, left elbow: Secondary | ICD-10-CM | POA: Diagnosis not present

## 2017-09-24 DIAGNOSIS — M545 Low back pain: Secondary | ICD-10-CM | POA: Diagnosis not present

## 2018-04-12 DIAGNOSIS — M25562 Pain in left knee: Secondary | ICD-10-CM | POA: Diagnosis not present

## 2018-04-12 DIAGNOSIS — M25552 Pain in left hip: Secondary | ICD-10-CM | POA: Diagnosis not present

## 2018-04-18 DIAGNOSIS — M25511 Pain in right shoulder: Secondary | ICD-10-CM | POA: Diagnosis not present

## 2018-05-18 DIAGNOSIS — Z125 Encounter for screening for malignant neoplasm of prostate: Secondary | ICD-10-CM | POA: Diagnosis not present

## 2018-05-18 DIAGNOSIS — Z13220 Encounter for screening for lipoid disorders: Secondary | ICD-10-CM | POA: Diagnosis not present

## 2018-05-18 DIAGNOSIS — Z0000 Encounter for general adult medical examination without abnormal findings: Secondary | ICD-10-CM | POA: Diagnosis not present

## 2018-05-24 DIAGNOSIS — Z6841 Body Mass Index (BMI) 40.0 and over, adult: Secondary | ICD-10-CM | POA: Diagnosis not present

## 2018-05-24 DIAGNOSIS — N281 Cyst of kidney, acquired: Secondary | ICD-10-CM | POA: Diagnosis not present

## 2018-05-24 DIAGNOSIS — N182 Chronic kidney disease, stage 2 (mild): Secondary | ICD-10-CM | POA: Diagnosis not present

## 2018-07-28 DIAGNOSIS — R635 Abnormal weight gain: Secondary | ICD-10-CM | POA: Diagnosis not present

## 2018-07-28 DIAGNOSIS — Z79899 Other long term (current) drug therapy: Secondary | ICD-10-CM | POA: Diagnosis not present

## 2018-07-28 DIAGNOSIS — R7303 Prediabetes: Secondary | ICD-10-CM | POA: Diagnosis not present

## 2018-07-28 DIAGNOSIS — I498 Other specified cardiac arrhythmias: Secondary | ICD-10-CM | POA: Diagnosis not present

## 2018-07-28 DIAGNOSIS — E669 Obesity, unspecified: Secondary | ICD-10-CM | POA: Diagnosis not present

## 2018-07-28 DIAGNOSIS — Z6839 Body mass index (BMI) 39.0-39.9, adult: Secondary | ICD-10-CM | POA: Diagnosis not present

## 2018-07-28 DIAGNOSIS — I444 Left anterior fascicular block: Secondary | ICD-10-CM | POA: Diagnosis not present

## 2018-09-12 DIAGNOSIS — Z01818 Encounter for other preprocedural examination: Secondary | ICD-10-CM | POA: Diagnosis not present

## 2018-11-16 ENCOUNTER — Other Ambulatory Visit: Payer: Self-pay

## 2018-11-16 DIAGNOSIS — R6889 Other general symptoms and signs: Secondary | ICD-10-CM | POA: Diagnosis not present

## 2018-11-16 DIAGNOSIS — Z20822 Contact with and (suspected) exposure to covid-19: Secondary | ICD-10-CM

## 2018-11-17 LAB — NOVEL CORONAVIRUS, NAA: SARS-CoV-2, NAA: NOT DETECTED

## 2019-01-19 IMAGING — MR MR FOOT*R* W/O CM
4 of 6 series · 23 of 40 positions shown · non-contrast
Comparison: None.

CLINICAL DATA: Pain along the fifth metatarsal.

EXAM:
MRI OF THE RIGHT FOREFOOT WITHOUT CONTRAST
TECHNIQUE: Multiplanar, multisequence MR imaging of the right forefoot was
performed. No intravenous contrast was administered.

[Series 5: T1 · coronal · 4.0mm · 0.44mm/px · 4 of 39 slices shown]
[im 1/39]
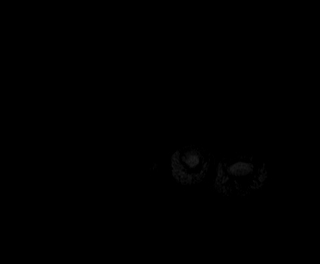
[im 6/39]
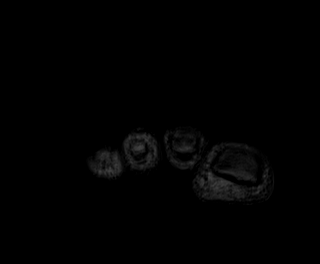
[im 22/39]
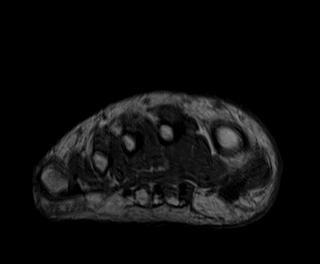
[im 33/39]
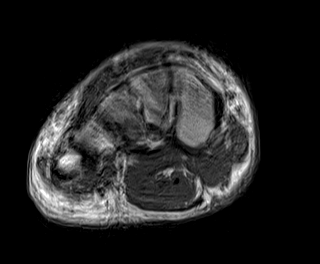

[Series 6: T2 fat-sat · coronal · 4.0mm · 0.27mm/px · 8 of 41 slices shown (1 of 3)]
[im 1/41]
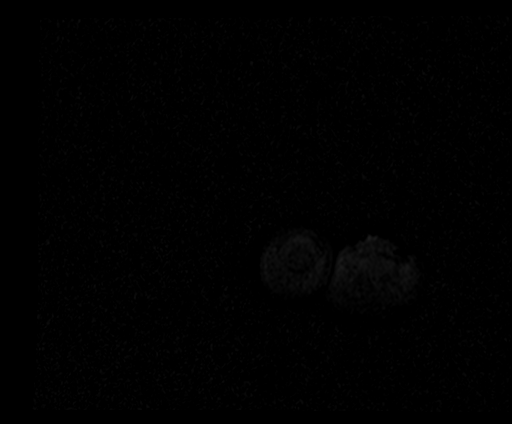
[im 6/41]
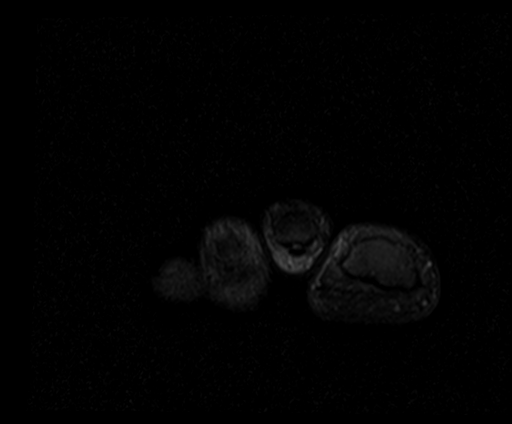
[im 12/41]
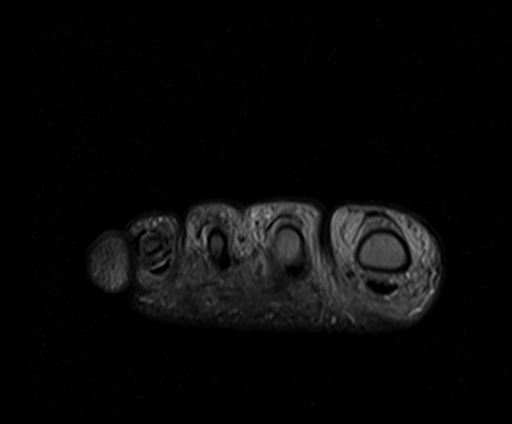
[im 18/41]
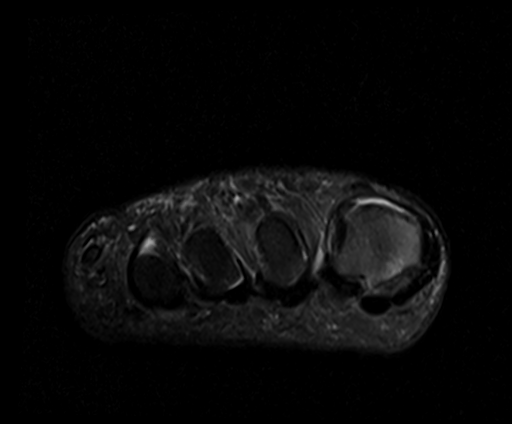
[im 23/41]
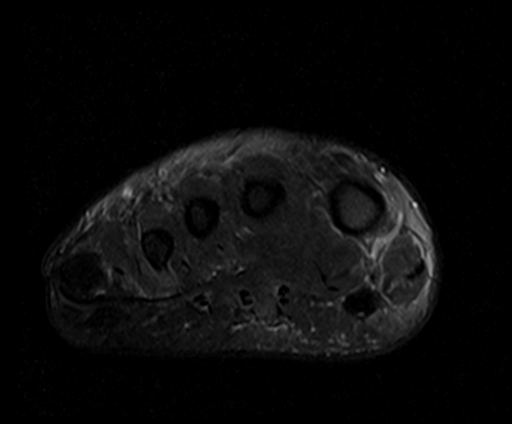
[im 29/41]
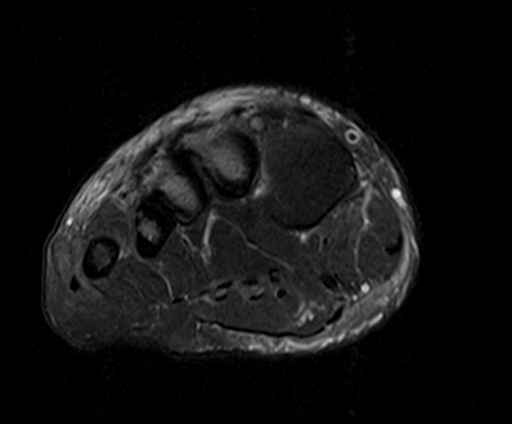
[im 35/41]
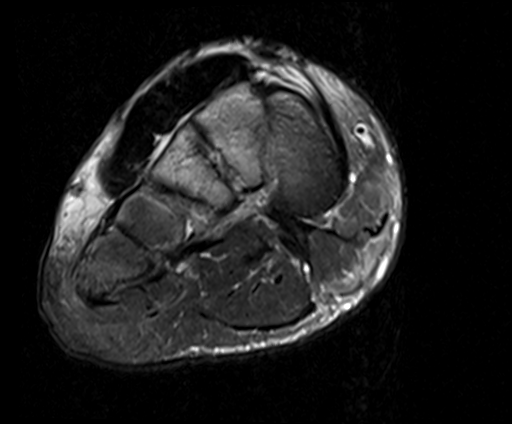
[im 41/41]
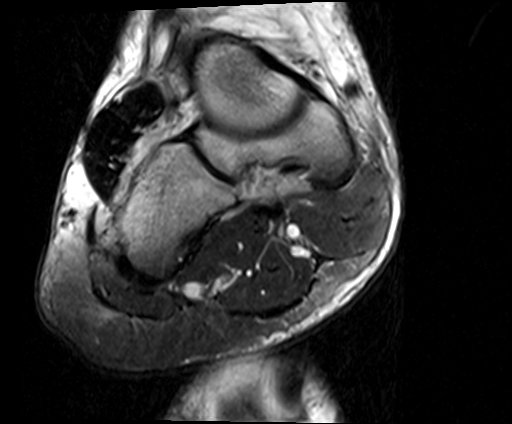

[Series 7: T2 fat-sat · sagittal · 3.5mm · 0.43mm/px · 7 of 32 slices shown (2 of 3)]
[im 1/32]
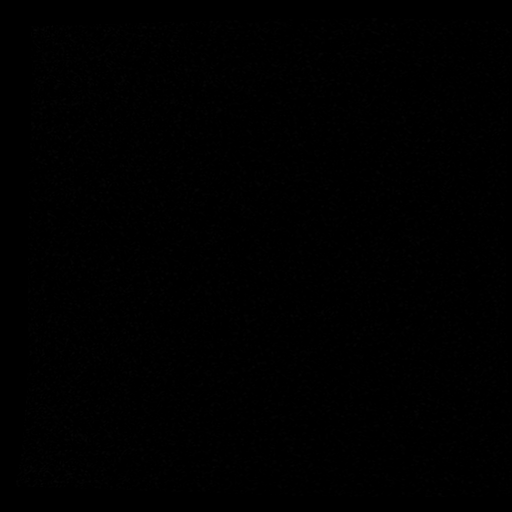
[im 6/32]
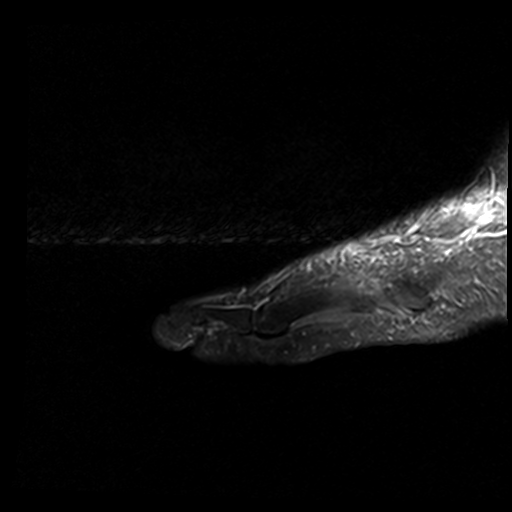
[im 11/32]
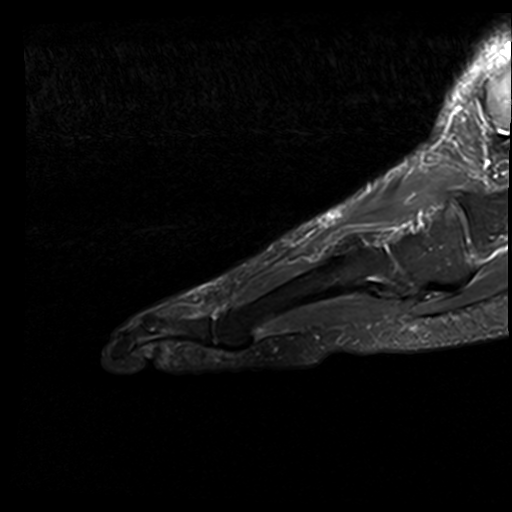
[im 16/32]
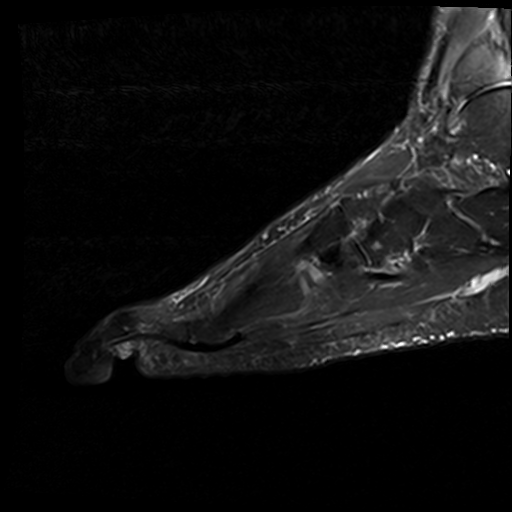
[im 21/32]
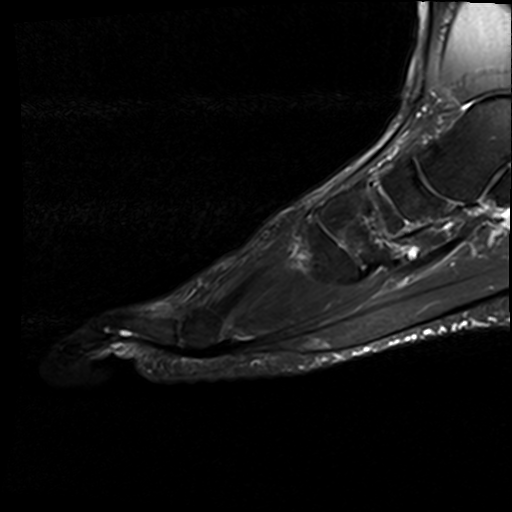
[im 26/32]
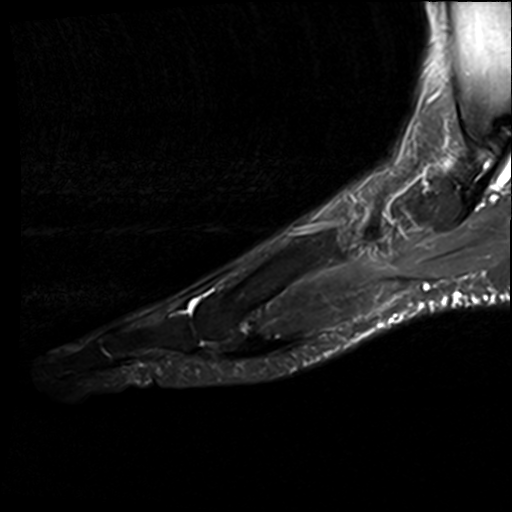
[im 32/32]
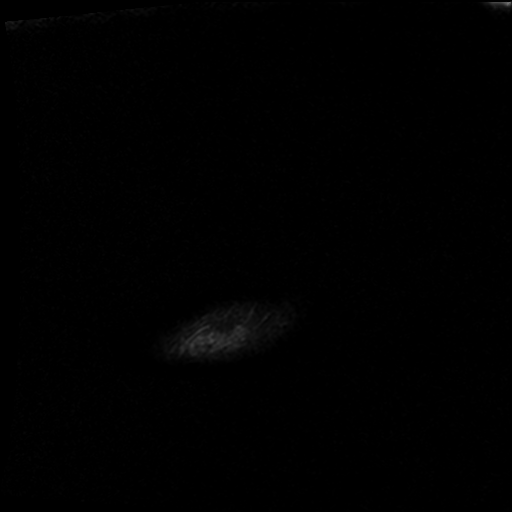

[Series 9: T2 fat-sat · axial · 3.0mm · 0.45mm/px · z∈[-122,-64]mm · 4 of 20 slices shown (3 of 3)]
[im 1/20]
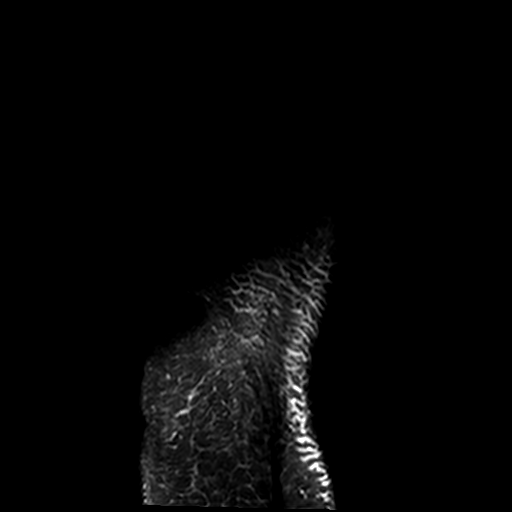
[im 7/20]
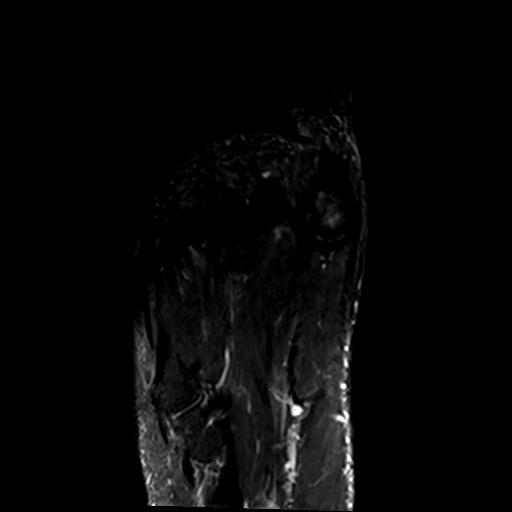
[im 13/20]
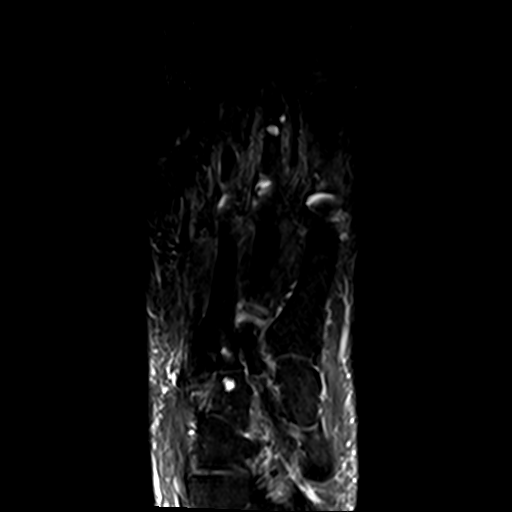
[im 20/20]
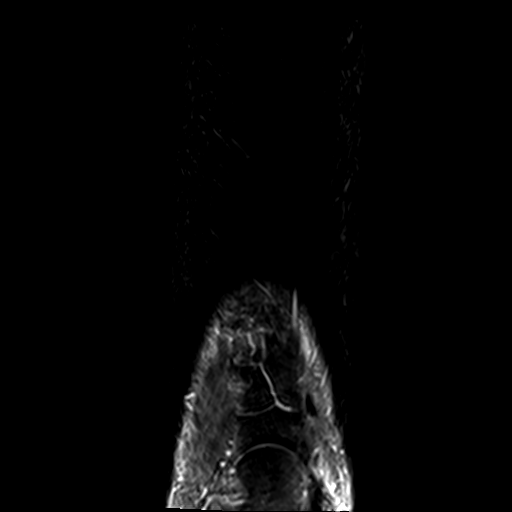

[23 of 40 positions shown; findings below may reference images not displayed]

FINDINGS: Bones/Joint/Cartilage

No metatarsal stress fracture is identified. Mild degenerative
findings at the Lisfranc joint including mild spurring and small
subcortical cysts along the base of the third metatarsal and
adjacent lateral cuneiform. Small subcortical cyst in the medial
cuneiform of the great toe with some underlying mild subcortical
marrow edema along the adjacent articulating portion of the first
metatarsal, image [DATE]. There may be a component of mild
sesamoiditis, most likely secondary to the degenerative arthropathy.

Ligaments

Lisfranc ligament intact.  No appreciable plantar plate injury.

Muscles and Tendons

The distal peroneus brevis tendon appears intact. There is some
peroneus longus tenosynovitis adjacent to the distal calcaneus for
example on image [DATE], but the rest of the peroneus longus appears
unremarkable.

Soft tissues

I do not perceive a significant intermetatarsal mass to suggest
Morton's neuroma.
IMPRESSION: 1. A specific cause for the patient's lateral foot pain is not
identified, although there is some tenosynovitis of the peroneus
longus tendon adjacent to the distal calcaneus.
2. Mild degenerative findings at the Lisfranc joint.
3. Moderate degenerative findings between the head of the first
metatarsal and the medial sesamoid, potentially with low-grade
secondary sesamoiditis.

## 2023-06-17 ENCOUNTER — Ambulatory Visit: Admission: EM | Admit: 2023-06-17 | Discharge: 2023-06-17 | Disposition: A | Discharge status: Home / Self Care

## 2023-06-17 ENCOUNTER — Encounter: Payer: Self-pay | Admitting: Emergency Medicine

## 2023-06-17 ENCOUNTER — Ambulatory Visit: Admitting: Radiology

## 2023-06-17 DIAGNOSIS — B349 Viral infection, unspecified: Secondary | ICD-10-CM | POA: Diagnosis not present

## 2023-06-17 MED ORDER — AZELASTINE HCL 0.1 % NA SOLN: 1.0000 | Freq: Two times a day (BID) | NASAL | 1 refills | Status: AC

## 2023-06-17 MED ORDER — PROMETHAZINE-DM 6.25-15 MG/5ML PO SYRP
5.0000 mL | ORAL_SOLUTION | Freq: Four times a day (QID) | ORAL | 0 refills | Status: AC | PRN
Start: 2023-06-17 — End: ?

## 2023-06-17 NOTE — ED Triage Notes (Signed)
 Pt c/o cough congestion for 1 week that is getting worse. He also c/o right rib pain that happened a few years ago and is now hurting again

## 2023-06-17 NOTE — Discharge Instructions (Addendum)
  1. Acute viral syndrome (Primary) - DG Ribs Unilateral W/ Chest Right x-ray performed in UC shows no acute rib injury, no significant cardiopulmonary processes, no sign of consolidation or pneumonia. - azelastine  (ASTELIN ) 0.1 % nasal spray; Place 1 spray into both nostrils 2 (two) times daily. Use in each nostril as directed  Dispense: 30 mL; Refill: 1 - promethazine -dextromethorphan (PROMETHAZINE -DM) 6.25-15 MG/5ML syrup; Take 5 mLs by mouth 4 (four) times daily as needed for cough.  Dispense: 118 mL; Refill: 0 -Continue to monitor symptoms for any change in severity if there is any escalation of current symptoms or development of new symptoms follow-up in ER for further evaluation and management.

## 2023-06-17 NOTE — ED Provider Notes (Signed)
 [facility]  Note:  This document was prepared using Dragon voice recognition software and may include unintentional dictation errors.  MRN: [id number] DOB: 1965-05-04  Subjective:   Steven Dawson is a 58 y.o. male presenting for persistent cough x 1 week which seems to be getting worse.  Patient has developed right-sided rib pain with cough.  No fever, shortness of breath, chest pain, weakness, dizziness.  Patient not taking any over-the-counter medication to treat symptoms.  Patient denies any known injury or trauma to the right ribs but states that a few years ago Steven Dawson had some right rib pain that resolved but has now returned with physical  No current facility-administered medications for this encounter.  Current Outpatient Medications:    azelastine  (ASTELIN ) 0.1 % nasal spray, Place 1 spray into both nostrils 2 (two) times daily. Use in each nostril as directed, Disp: 30 mL, Rfl: 1   liothyronine (CYTOMEL) 25 MCG tablet, Take 12.5 mcg by mouth 2 (two) times daily., Disp: , Rfl:    promethazine -dextromethorphan (PROMETHAZINE -DM) 6.25-15 MG/5ML syrup, Take 5 mLs by mouth 4 (four) times daily as needed for cough., Disp: 118 mL, Rfl: 0   Somatropin (OMNITROPE) 5 MG/1.5ML SOCT, Inject into the skin., Disp: , Rfl:    tadalafil (CIALIS) 5 MG tablet, Take by mouth daily as needed for erectile dysfunction., Disp: , Rfl:    testosterone cypionate (DEPOTESTOSTERONE CYPIONATE) 200 MG/ML injection, Inject into the muscle every 14 (fourteen) days., Disp: , Rfl:    tirzepatide (ZEPBOUND) 2.5 MG/0.5ML injection vial, Inject into the skin., Disp: , Rfl:    No Known Allergies  Past Medical History:  Diagnosis Date   Broken fingers    Broken toe    Chronic kidney disease    Obesity      Past Surgical History:  Procedure Laterality Date   pin in thumb     TONSILLECTOMY      Family History  Problem Relation Age of Onset   Cancer Father    Ulcers Brother        internal  bleeding    Social History   Tobacco Use   Smoking status: Never   Smokeless tobacco: Never  Substance Use Topics   Alcohol use: Yes    Comment: 4-5 x yearly   Drug use: No    ROS Refer to HPI for ROS details.  Objective:    Vitals: BP (!) 162/106 (BP Location: Right Arm)   Pulse 74   Temp 98.1 F (36.7 C) (Oral)   Resp 18   Physical Exam Vitals and nursing note reviewed.  Constitutional:      General: Steven Dawson is not in acute distress.    Appearance: Steven Dawson is well-developed. Steven Dawson is not ill-appearing or toxic-appearing.  HENT:     Head: Normocephalic.     Nose: Congestion present. No rhinorrhea.     Mouth/Throat:     Mouth: Mucous membranes are moist.     Pharynx: Oropharynx is clear.  Cardiovascular:     Rate and Rhythm: Normal rate and regular rhythm.     Heart sounds: Normal heart sounds. No murmur heard. Pulmonary:     Effort: Pulmonary effort is normal. No respiratory distress.     Breath sounds: Normal breath sounds. No stridor. No wheezing, rhonchi or rales.  Chest:     Chest wall: No tenderness.  Musculoskeletal:        General: Normal range of motion.  Skin:    General: Skin is warm  and dry.  Neurological:     General: No focal deficit present.     Mental Status: Steven Dawson is alert and oriented to person, place, and time.  Psychiatric:        Mood and Affect: Mood normal.        Behavior: Behavior normal.     Procedures  No results found for this or any previous visit (from the past 24 hours).  Assessment and Plan :     Discharge Instructions       1. Acute viral syndrome (Primary) - DG Ribs Unilateral W/ Chest Right x-ray performed in UC shows no acute rib injury, no significant cardiopulmonary processes, no sign of consolidation or pneumonia. - azelastine  (ASTELIN ) 0.1 % nasal spray; Place 1 spray into both nostrils 2 (two) times daily. Use in each nostril as directed  Dispense: 30 mL; Refill: 1 - promethazine -dextromethorphan (PROMETHAZINE -DM)  6.25-15 MG/5ML syrup; Take 5 mLs by mouth 4 (four) times daily as needed for cough.  Dispense: 118 mL; Refill: 0 -Continue to monitor symptoms for any change in severity if there is any escalation of current symptoms or development of new symptoms follow-up in ER for further evaluation and management.      Katielynn Horan B Chloe Bluett   Paulmichael Schreck, Daniel B, [place] 06/17/23 1127
